# Patient Record
Sex: Female | Born: 1965 | Race: White | Hispanic: No | Marital: Single | Smoking: Never smoker
Health system: Southern US, Community
[De-identification: ages and names within clinical notes are randomized; demographics above are authoritative.]

## PROBLEM LIST (undated history)

## (undated) DIAGNOSIS — G43909 Migraine, unspecified, not intractable, without status migrainosus: Secondary | ICD-10-CM

## (undated) DIAGNOSIS — F419 Anxiety disorder, unspecified: Secondary | ICD-10-CM

## (undated) DIAGNOSIS — M1990 Unspecified osteoarthritis, unspecified site: Secondary | ICD-10-CM

## (undated) HISTORY — DX: Unspecified osteoarthritis, unspecified site: M19.90

## (undated) HISTORY — DX: Anxiety disorder, unspecified: F41.9

## (undated) HISTORY — PX: BREAST CYST ASPIRATION: SHX578

## (undated) HISTORY — PX: LEG SURGERY: SHX1003

---

## 2007-09-05 ENCOUNTER — Emergency Department: Payer: Self-pay | Admitting: Emergency Medicine

## 2008-04-10 ENCOUNTER — Ambulatory Visit: Payer: Self-pay | Admitting: Orthopaedic Surgery

## 2008-04-29 ENCOUNTER — Ambulatory Visit: Payer: Self-pay

## 2008-05-17 ENCOUNTER — Ambulatory Visit: Payer: Self-pay | Admitting: Unknown Physician Specialty

## 2008-05-21 ENCOUNTER — Ambulatory Visit: Payer: Self-pay | Admitting: Unknown Physician Specialty

## 2010-02-05 ENCOUNTER — Emergency Department: Payer: Self-pay | Admitting: Emergency Medicine

## 2010-03-28 ENCOUNTER — Ambulatory Visit: Payer: Self-pay | Admitting: Obstetrics and Gynecology

## 2010-04-11 ENCOUNTER — Ambulatory Visit: Payer: Self-pay | Admitting: Obstetrics and Gynecology

## 2010-11-20 ENCOUNTER — Ambulatory Visit: Payer: Self-pay | Admitting: Obstetrics and Gynecology

## 2010-12-23 ENCOUNTER — Ambulatory Visit: Payer: Self-pay

## 2011-03-26 ENCOUNTER — Ambulatory Visit: Payer: Self-pay | Admitting: Internal Medicine

## 2011-08-06 ENCOUNTER — Ambulatory Visit: Payer: Self-pay | Admitting: Obstetrics and Gynecology

## 2013-01-01 ENCOUNTER — Ambulatory Visit: Payer: Self-pay | Admitting: Obstetrics and Gynecology

## 2015-05-05 ENCOUNTER — Other Ambulatory Visit: Payer: Self-pay | Admitting: Obstetrics and Gynecology

## 2015-05-05 DIAGNOSIS — Z1231 Encounter for screening mammogram for malignant neoplasm of breast: Secondary | ICD-10-CM

## 2015-05-06 ENCOUNTER — Ambulatory Visit
Admission: RE | Admit: 2015-05-06 | Discharge: 2015-05-06 | Disposition: A | Discharge status: Home / Self Care | Payer: BC Managed Care – PPO | Source: Ambulatory Visit | Attending: Obstetrics and Gynecology | Admitting: Obstetrics and Gynecology

## 2015-05-06 DIAGNOSIS — Z1231 Encounter for screening mammogram for malignant neoplasm of breast: Secondary | ICD-10-CM | POA: Insufficient documentation

## 2015-05-16 ENCOUNTER — Other Ambulatory Visit: Payer: Self-pay | Admitting: Orthopedic Surgery

## 2015-05-16 DIAGNOSIS — G8929 Other chronic pain: Secondary | ICD-10-CM

## 2015-05-16 DIAGNOSIS — M25561 Pain in right knee: Principal | ICD-10-CM

## 2015-05-23 ENCOUNTER — Ambulatory Visit: Payer: BC Managed Care – PPO

## 2015-12-01 ENCOUNTER — Other Ambulatory Visit: Payer: Self-pay | Admitting: Obstetrics and Gynecology

## 2015-12-01 DIAGNOSIS — Z1231 Encounter for screening mammogram for malignant neoplasm of breast: Secondary | ICD-10-CM

## 2016-05-07 ENCOUNTER — Ambulatory Visit: Payer: BC Managed Care – PPO | Attending: Obstetrics and Gynecology

## 2016-07-23 ENCOUNTER — Ambulatory Visit
Admission: RE | Admit: 2016-07-23 | Discharge: 2016-07-23 | Disposition: A | Discharge status: Home / Self Care | Payer: BC Managed Care – PPO | Source: Ambulatory Visit | Attending: Obstetrics and Gynecology | Admitting: Obstetrics and Gynecology

## 2016-07-23 DIAGNOSIS — Z1231 Encounter for screening mammogram for malignant neoplasm of breast: Secondary | ICD-10-CM | POA: Insufficient documentation

## 2017-05-28 ENCOUNTER — Other Ambulatory Visit: Payer: Self-pay | Admitting: Obstetrics and Gynecology

## 2017-05-28 DIAGNOSIS — Z1231 Encounter for screening mammogram for malignant neoplasm of breast: Secondary | ICD-10-CM

## 2017-11-25 ENCOUNTER — Ambulatory Visit: Payer: BC Managed Care – PPO | Attending: Obstetrics and Gynecology

## 2018-04-21 ENCOUNTER — Other Ambulatory Visit: Payer: Self-pay | Admitting: Obstetrics and Gynecology

## 2018-04-21 DIAGNOSIS — Z1231 Encounter for screening mammogram for malignant neoplasm of breast: Secondary | ICD-10-CM

## 2018-05-16 ENCOUNTER — Ambulatory Visit
Admission: RE | Admit: 2018-05-16 | Discharge: 2018-05-16 | Disposition: A | Discharge status: Home / Self Care | Payer: BC Managed Care – PPO | Source: Ambulatory Visit | Attending: Obstetrics and Gynecology | Admitting: Obstetrics and Gynecology

## 2018-05-16 DIAGNOSIS — Z1231 Encounter for screening mammogram for malignant neoplasm of breast: Secondary | ICD-10-CM

## 2018-09-21 ENCOUNTER — Encounter: Payer: Self-pay | Admitting: Emergency Medicine

## 2018-09-21 ENCOUNTER — Other Ambulatory Visit: Payer: Self-pay

## 2018-09-21 ENCOUNTER — Emergency Department
Admission: EM | Admit: 2018-09-21 | Discharge: 2018-09-21 | Disposition: A | Discharge status: Home / Self Care | Payer: BC Managed Care – PPO | Attending: Emergency Medicine | Admitting: Emergency Medicine

## 2018-09-21 DIAGNOSIS — R51 Headache: Secondary | ICD-10-CM | POA: Diagnosis not present

## 2018-09-21 DIAGNOSIS — R519 Headache, unspecified: Secondary | ICD-10-CM

## 2018-09-21 HISTORY — DX: Migraine, unspecified, not intractable, without status migrainosus: G43.909

## 2018-09-21 MED ORDER — DIPHENHYDRAMINE HCL 50 MG/ML IJ SOLN
25.0000 mg | Freq: Once | INTRAMUSCULAR | Status: AC
  Administered 2018-09-21: 25 mg via INTRAVENOUS
  Filled 2018-09-21: qty 1

## 2018-09-21 MED ORDER — SODIUM CHLORIDE 0.9 % IV BOLUS
1000.0000 mL | Freq: Once | INTRAVENOUS | Status: AC
  Administered 2018-09-21: 1000 mL via INTRAVENOUS

## 2018-09-21 MED ORDER — MAGNESIUM SULFATE 2 GM/50ML IV SOLN
2.0000 g | Freq: Once | INTRAVENOUS | Status: AC
  Administered 2018-09-21: 2 g via INTRAVENOUS
  Filled 2018-09-21: qty 50

## 2018-09-21 MED ORDER — KETOROLAC TROMETHAMINE 30 MG/ML IJ SOLN
30.0000 mg | Freq: Once | INTRAMUSCULAR | Status: AC
Start: 2018-09-21 — End: 2018-09-21
  Administered 2018-09-21: 30 mg via INTRAVENOUS
  Filled 2018-09-21: qty 1

## 2018-09-21 MED ORDER — PROCHLORPERAZINE EDISYLATE 10 MG/2ML IJ SOLN
10.0000 mg | Freq: Once | INTRAMUSCULAR | Status: AC
  Administered 2018-09-21: 10 mg via INTRAVENOUS
  Filled 2018-09-21: qty 2

## 2018-09-21 NOTE — ED Notes (Addendum)
Pt reports nausea has improved, but continues to complain of a headache, IVF infusing. Informed patient we are waiting for room in ED to open. Son sitting with patient in subwait.

## 2018-09-21 NOTE — ED Triage Notes (Signed)
Pt arrived via POV with reports of migraine since awakening this morning. Pt states she took her migraine medication this morning but vomited shortly after taking it. Pt continues to feel nauseated. Pt has some light sensitivity. Pt states it feels like bad migraine which she states she hasn't had in a long time.

## 2018-09-21 NOTE — ED Provider Notes (Signed)
[facility] Emergency Department Provider Note ____________________________________________   I have reviewed the triage vital signs and the triage nursing note.  HISTORY  Chief Complaint Migraine   Historian Patient  HPI Peggy Stewart is a 52 y.o. female with history of migraines, takes Imitrex at home, 200 mg at onset, however with this headache that started this morning gradual onset when she woke up this morning worsening associate with nausea, threw up and then took a second Imitrex thinking that she probably threw up the initial pill, threw up again.  States it has been a while since she had a migraine this bad but this does feel like a typical migraine just with significant nausea.  No abdominal pain.  No diarrhea.  No chest pain.  No trouble breathing or shortness of breath.  No vision changes.  No focal weakness or numbness or confusion or altered mental status.  No seizure.  Headache moderate to severe about 8 out of him when she presented.  6 out of 10 after initial triage medications that were ordered Compazine and Benadryl and IV fluids.       Past Medical History:  Diagnosis Date  . Migraine     There are no active problems to display for this patient.   Past Surgical History:  Procedure Laterality Date  . BREAST CYST ASPIRATION Left     Prior to Admission medications   Not on File    Allergies  Allergen Reactions  . Penicillins Other (See Comments)    C. Diff colitis C diff     Family History  Problem Relation Age of Onset  . Breast cancer Mother 4165    Social History Social History   Tobacco Use  . Smoking status: Never Smoker  . Smokeless tobacco: Never Used  Substance Use Topics  . Alcohol use: Not on file  . Drug use: Not on file    Review of Systems  Constitutional: Negative for fever. Eyes: Negative for visual changes. ENT: Negative for sore throat. Cardiovascular: Negative for chest  pain. Respiratory: Negative for shortness of breath. Gastrointestinal: Negative for abdominal pain, vomiting and diarrhea. Genitourinary: Negative for dysuria. Musculoskeletal: Negative for back pain. Skin: Negative for rash. Neurological: Positive as per HPI for headache.  ____________________________________________   PHYSICAL EXAM:  VITAL SIGNS: ED Triage Vitals  Enc Vitals Group     BP 09/21/18 1143 (!) 162/90     Pulse Rate 09/21/18 1143 75     Resp 09/21/18 1143 18     Temp 09/21/18 1143 (!) 97.5 F (36.4 C)     Temp Source 09/21/18 1143 Oral     SpO2 09/21/18 1143 98 %     Weight 09/21/18 1145 120 lb (54.4 kg)     Height 09/21/18 1145 5\' 1"  (1.549 m)     Head Circumference --      Peak Flow --      Pain Score 09/21/18 1145 8     Pain Loc --      Pain Edu? --      Excl. in GC? --      Constitutional: Alert and oriented.  HEENT      Head: Normocephalic and atraumatic.      Eyes: Conjunctivae are normal. Pupils equal and round.       Ears:         Nose: No congestion/rhinnorhea.      Mouth/Throat: Mucous membranes are moist.      Neck: No stridor.  Cardiovascular/Chest: Normal rate, regular rhythm.  No murmurs, rubs, or gallops. Respiratory: Normal respiratory effort without tachypnea nor retractions. Breath sounds are clear and equal bilaterally. No wheezes/rales/rhonchi. Gastrointestinal: Soft. No distention, no guarding, no rebound. Nontender.    Genitourinary/rectal:Deferred Musculoskeletal: Nontender with normal range of motion in all extremities. No joint effusions.  No lower extremity tenderness.  No edema. Neurologic:  Normal speech and language. No gross or focal neurologic deficits are appreciated. Skin:  Skin is warm, dry and intact. No rash noted. Psychiatric: Mood and affect are normal. Speech and behavior are normal. Patient exhibits appropriate insight and judgment.   ____________________________________________  LABS (pertinent  positives/negatives) I, Governor Rooks, MD the attending physician have reviewed the labs noted below.  Labs Reviewed - No data to display  ____________________________________________    EKG I, Governor Rooks, MD, the attending physician have personally viewed and interpreted all ECGs.  None ____________________________________________  RADIOLOGY   None __________________________________________  PROCEDURES  Procedure(s) performed: None  Procedures  Critical Care performed: None   ____________________________________________  ED COURSE / ASSESSMENT AND PLAN  Pertinent labs & imaging results that were available during my care of the patient were reviewed by me and considered in my medical decision making (see chart for details).    Patient here for nonspecific headache that seems similar to her prior migraines without any focal neurologic findings, or any high risk red flag features of the headache.  Patient had some improvement from 8 out of 10 down to 6 out of 10 after initial treatment.  Patient was requesting to take additional Imitrex, however she did take 400 mg although she threw up, it is unclear how much she would have absorbed so I did recommend against this.   Reevaluation, pain down to 2 out of 10, okay for discharge.    CONSULTATIONS: None  Patient / Family / Caregiver informed of clinical course, medical decision-making process, and agree with plan.   I discussed return precautions, follow-up instructions, and discharge instructions with patient and/or family.  Discharge Instructions : Return to emergency department immediately for any worsening condition including new or worsening headache, confusion altered mental status, fever, vision changes, weakness, numbness, or any other symptoms concerning to you.    ___________________________________________   FINAL CLINICAL IMPRESSION(S) / ED DIAGNOSES   Final diagnoses:  Headache, unspecified  headache type      ___________________________________________         Note: This dictation was prepared with Dragon dictation. Any transcriptional errors that result from this process are unintentional    Governor Rooks, MD 09/21/18 1610

## 2018-09-21 NOTE — ED Notes (Signed)
Discussed with Dr. Pershing ProudSchaevitz pt's chief complaint, new orders received for IV fluids, compazine and benadryl.

## 2018-09-21 NOTE — Discharge Instructions (Signed)
Return to emergency department immediately for any worsening condition including new or worsening headache, confusion altered mental status, fever, vision changes, weakness, numbness, or any other symptoms concerning to you.

## 2018-09-21 NOTE — ED Notes (Signed)
Pt up to bathroom.  Steady on feet.  No assistance needed.

## 2018-09-21 NOTE — ED Notes (Signed)
Pt placed in subwait, son sitting with patient, waiting for room to become available.

## 2019-05-18 ENCOUNTER — Other Ambulatory Visit: Payer: Self-pay | Admitting: Obstetrics and Gynecology

## 2019-05-18 DIAGNOSIS — Z1231 Encounter for screening mammogram for malignant neoplasm of breast: Secondary | ICD-10-CM

## 2019-11-18 ENCOUNTER — Ambulatory Visit: Payer: BC Managed Care – PPO | Attending: Internal Medicine

## 2020-04-11 ENCOUNTER — Other Ambulatory Visit: Payer: Self-pay | Admitting: Obstetrics and Gynecology

## 2020-04-11 DIAGNOSIS — Z1231 Encounter for screening mammogram for malignant neoplasm of breast: Secondary | ICD-10-CM

## 2020-04-26 ENCOUNTER — Ambulatory Visit
Admission: RE | Admit: 2020-04-26 | Discharge: 2020-04-26 | Disposition: A | Discharge status: Home / Self Care | Payer: BC Managed Care – PPO | Source: Ambulatory Visit | Attending: Obstetrics and Gynecology | Admitting: Obstetrics and Gynecology

## 2020-04-26 DIAGNOSIS — Z1231 Encounter for screening mammogram for malignant neoplasm of breast: Secondary | ICD-10-CM | POA: Diagnosis not present

## 2020-05-23 ENCOUNTER — Encounter: Payer: Self-pay | Admitting: *Deleted

## 2021-04-20 ENCOUNTER — Other Ambulatory Visit: Payer: Self-pay | Admitting: Obstetrics and Gynecology

## 2022-02-12 ENCOUNTER — Ambulatory Visit: Payer: BC Managed Care – PPO | Admitting: Family Medicine

## 2022-02-12 ENCOUNTER — Encounter: Payer: Self-pay | Admitting: Family Medicine

## 2022-02-12 ENCOUNTER — Other Ambulatory Visit (HOSPITAL_COMMUNITY)
Admission: RE | Admit: 2022-02-12 | Discharge: 2022-02-12 | Disposition: A | Discharge status: Home / Self Care | Payer: BC Managed Care – PPO | Source: Ambulatory Visit | Attending: Family Medicine | Admitting: Family Medicine

## 2022-02-12 VITALS — BP 164/95 | HR 98 | Wt 126.0 lb

## 2022-02-12 DIAGNOSIS — G43829 Menstrual migraine, not intractable, without status migrainosus: Secondary | ICD-10-CM

## 2022-02-12 DIAGNOSIS — Z1211 Encounter for screening for malignant neoplasm of colon: Secondary | ICD-10-CM | POA: Diagnosis not present

## 2022-02-12 DIAGNOSIS — Z124 Encounter for screening for malignant neoplasm of cervix: Secondary | ICD-10-CM | POA: Diagnosis present

## 2022-02-12 DIAGNOSIS — Z1239 Encounter for other screening for malignant neoplasm of breast: Secondary | ICD-10-CM

## 2022-02-12 DIAGNOSIS — F411 Generalized anxiety disorder: Secondary | ICD-10-CM

## 2022-02-12 DIAGNOSIS — R030 Elevated blood-pressure reading, without diagnosis of hypertension: Secondary | ICD-10-CM

## 2022-02-12 DIAGNOSIS — Z01419 Encounter for gynecological examination (general) (routine) without abnormal findings: Secondary | ICD-10-CM

## 2022-02-12 DIAGNOSIS — J45990 Exercise induced bronchospasm: Secondary | ICD-10-CM | POA: Insufficient documentation

## 2022-02-12 NOTE — Progress Notes (Signed)
LMP:10/2021 was very light no cycle since .hx of heavy bleeding. ?Last Pap:04/2020 WBL Hx of Abnormal paps. ?Contraception:no method ?STD Screening:No ?Family Hx of Breast Cancer:Yes Mother passed at 64. ?Family Hx of Ovarian Cancer:No ? ?Pt notes Anxiety ?Wants Annual Labs  ? ? ?Will Repeat Blood pressure at check out. Cuff was not reading.  ? ?  ?

## 2022-02-12 NOTE — Assessment & Plan Note (Signed)
99203 - to get cuff and see how BPs are at home. If persistently elevated, will see primary care and can start something. ?

## 2022-02-12 NOTE — Progress Notes (Signed)
Subjective:  ?  ? Peggy Stewart is a 56 y.o. female and is here for a comprehensive physical exam. The patient reports no problems. LMP was 10/2021. Light. Previously had heavy menses with terrible migraines with these. Cycles have lightened and her migraines have improved with peri-menopause. Has hot flashes, but no night flashes. Has h/o abnormal pap and has had a colposcopy. Last was 5 years ago. ?Reports anxiety, life-long. She is not wanting meds right now, but does have early am awakening and "catastropic" thinking. She does some centering. ? ? ?The following portions of the patient's history were reviewed and updated as appropriate: allergies, current medications, past family history, past medical history, past social history, past surgical history, and problem list. ? ?Review of Systems ?Pertinent items noted in HPI and remainder of comprehensive ROS otherwise negative.  ? ?Objective:  ? ? BP (!) 181/93   Pulse 93   Wt 126 lb (57.2 kg)   BMI 23.81 kg/m?  ?General appearance: alert, cooperative, and appears stated age ?Head: Normocephalic, without obvious abnormality, atraumatic ?Neck: no adenopathy, supple, symmetrical, trachea midline, and thyroid not enlarged, symmetric, no tenderness/mass/nodules ?Lungs: clear to auscultation bilaterally ?Breasts: normal appearance, no masses or tenderness ?Heart: regular rate and rhythm, S1, S2 normal, no murmur, click, rub or gallop ?Abdomen: soft, non-tender; bowel sounds normal; no masses,  no organomegaly ?Pelvic: cervix normal in appearance, external genitalia normal, no adnexal masses or tenderness, no cervical motion tenderness, uterus normal size, shape, and consistency, and vagina normal without discharge ?Extremities: extremities normal, atraumatic, no cyanosis or edema ?Pulses: 2+ and symmetric ?Skin: Skin color, texture, turgor normal. No rashes or lesions ?Lymph nodes: Cervical, supraclavicular, and axillary nodes normal. ?Neurologic: Grossly normal   ?  ?Assessment:  ? ? Healthy female exam.    ?  ?Plan:  ? ?Problem List Items Addressed This Visit   ? ?  ? Unprioritized  ? Elevated blood pressure reading  ?  84536 - to get cuff and see how BPs are at home. If persistently elevated, will see primary care and can start something. ?  ?  ? Menstrual migraine  ?  46803 - better with peri-menopause--has imitrex if needed ?  ?  ? Relevant Medications  ? ibuprofen (ADVIL) 200 MG tablet  ? SUMAtriptan (IMITREX) 25 MG tablet  ? Generalized anxiety disorder  ?  21224 - discussed mindfulness, meditation, yoga, self care and meds if needed.  ?  ?  ? ?Other Visit Diagnoses   ? ? Screening for malignant neoplasm of cervix    -  Primary  ? 82500  ? Relevant Orders  ? Cytology - PAP( [place])  ? Breast screening      ? 37048  ? Relevant Orders  ? MM 3D SCREEN BREAST BILATERAL  ? Encounter for gynecological examination without abnormal finding      ? 88916  ? Relevant Orders  ? CBC  ? TSH  ? Follicle stimulating hormone  ? Comprehensive metabolic panel  ? Hemoglobin A1c  ? Lipid panel  ? Screen for colon cancer      ? 94503  ? Relevant Orders  ? Ambulatory referral to Gastroenterology  ? ?  ? ?Return in 1 year (on 02/13/2023). ? ?  ?See After Visit Summary for Counseling Recommendations  ? ?

## 2022-02-12 NOTE — Assessment & Plan Note (Addendum)
97673 - better with peri-menopause--has imitrex if needed ?

## 2022-02-12 NOTE — Assessment & Plan Note (Signed)
09983 - discussed mindfulness, meditation, yoga, self care and meds if needed.  ?

## 2022-02-13 ENCOUNTER — Encounter: Payer: Self-pay | Admitting: Family Medicine

## 2022-02-13 LAB — LIPID PANEL
Chol/HDL Ratio: 3.6 ratio (ref 0.0–4.4)
Cholesterol, Total: 256 mg/dL — ABNORMAL HIGH (ref 100–199)
HDL: 72 mg/dL (ref >39)
LDL Chol Calc (NIH): 167 mg/dL — ABNORMAL HIGH (ref 0–99)
Triglycerides: 97 mg/dL (ref 0–149)
VLDL Cholesterol Cal: 17 mg/dL (ref 5–40)

## 2022-02-13 LAB — COMPREHENSIVE METABOLIC PANEL
ALT: 20 IU/L (ref 0–32)
AST: 23 IU/L (ref 0–40)
Albumin/Globulin Ratio: 1.9 (ref 1.2–2.2)
Albumin: 4.7 g/dL (ref 3.8–4.9)
Alkaline Phosphatase: 105 IU/L (ref 44–121)
BUN/Creatinine Ratio: 15 (ref 9–23)
BUN: 9 mg/dL (ref 6–24)
Bilirubin Total: 0.5 mg/dL (ref 0.0–1.2)
CO2: 24 mmol/L (ref 20–29)
Calcium: 9.5 mg/dL (ref 8.7–10.2)
Chloride: 105 mmol/L (ref 96–106)
Creatinine, Ser: 0.59 mg/dL (ref 0.57–1.00)
Globulin, Total: 2.5 g/dL (ref 1.5–4.5)
Glucose: 42 mg/dL — ABNORMAL LOW (ref 70–99)
Potassium: 4 mmol/L (ref 3.5–5.2)
Sodium: 144 mmol/L (ref 134–144)
Total Protein: 7.2 g/dL (ref 6.0–8.5)
eGFR: 108 mL/min/{1.73_m2} (ref >59)

## 2022-02-13 LAB — TSH: TSH: 1.54 u[IU]/mL (ref 0.450–4.500)

## 2022-02-13 LAB — FOLLICLE STIMULATING HORMONE: FSH: 98.1 m[IU]/mL

## 2022-02-13 LAB — CBC
Hematocrit: 42 % (ref 34.0–46.6)
Hemoglobin: 14.6 g/dL (ref 11.1–15.9)
MCH: 30.9 pg (ref 26.6–33.0)
MCHC: 34.8 g/dL (ref 31.5–35.7)
MCV: 89 fL (ref 79–97)
Platelets: 271 10*3/uL (ref 150–450)
RBC: 4.73 x10E6/uL (ref 3.77–5.28)
RDW: 12.1 % (ref 11.7–15.4)
WBC: 6.7 10*3/uL (ref 3.4–10.8)

## 2022-02-13 LAB — HEMOGLOBIN A1C
Est. average glucose Bld gHb Est-mCnc: 80 mg/dL
Hgb A1c MFr Bld: 4.4 % — ABNORMAL LOW (ref 4.8–5.6)

## 2022-02-14 LAB — SPECIMEN STATUS REPORT

## 2022-02-14 LAB — INSULIN AND C-PEPTIDE, SERUM
C-Peptide: 1.3 ng/mL (ref 1.1–4.4)
INSULIN: 3.7 u[IU]/mL (ref 2.6–24.9)

## 2022-02-15 LAB — CYTOLOGY - PAP
Comment: NEGATIVE
Diagnosis: NEGATIVE
Diagnosis: REACTIVE
High risk HPV: NEGATIVE

## 2022-03-14 ENCOUNTER — Ambulatory Visit
Admission: RE | Admit: 2022-03-14 | Discharge: 2022-03-14 | Disposition: A | Discharge status: Home / Self Care | Payer: BC Managed Care – PPO | Source: Ambulatory Visit | Attending: Family Medicine | Admitting: Family Medicine

## 2022-03-14 DIAGNOSIS — Z1239 Encounter for other screening for malignant neoplasm of breast: Secondary | ICD-10-CM | POA: Diagnosis present

## 2022-03-14 DIAGNOSIS — Z1231 Encounter for screening mammogram for malignant neoplasm of breast: Secondary | ICD-10-CM | POA: Diagnosis not present

## 2023-02-25 ENCOUNTER — Ambulatory Visit: Payer: BC Managed Care – PPO | Admitting: Family Medicine

## 2023-04-22 ENCOUNTER — Other Ambulatory Visit (HOSPITAL_COMMUNITY)
Admission: RE | Admit: 2023-04-22 | Discharge: 2023-04-22 | Disposition: A | Discharge status: Home / Self Care | Payer: BC Managed Care – PPO | Source: Ambulatory Visit | Attending: Family Medicine | Admitting: Family Medicine

## 2023-04-22 ENCOUNTER — Encounter: Payer: Self-pay | Admitting: Family Medicine

## 2023-04-22 ENCOUNTER — Ambulatory Visit (INDEPENDENT_AMBULATORY_CARE_PROVIDER_SITE_OTHER): Payer: BC Managed Care – PPO | Admitting: Family Medicine

## 2023-04-22 VITALS — BP 144/82 | HR 91 | Wt 127.0 lb

## 2023-04-22 DIAGNOSIS — F411 Generalized anxiety disorder: Secondary | ICD-10-CM | POA: Diagnosis not present

## 2023-04-22 DIAGNOSIS — Z1231 Encounter for screening mammogram for malignant neoplasm of breast: Secondary | ICD-10-CM | POA: Diagnosis not present

## 2023-04-22 DIAGNOSIS — Z01419 Encounter for gynecological examination (general) (routine) without abnormal findings: Secondary | ICD-10-CM

## 2023-04-22 DIAGNOSIS — Z124 Encounter for screening for malignant neoplasm of cervix: Secondary | ICD-10-CM | POA: Diagnosis present

## 2023-04-22 DIAGNOSIS — Z1339 Encounter for screening examination for other mental health and behavioral disorders: Secondary | ICD-10-CM | POA: Diagnosis not present

## 2023-04-22 DIAGNOSIS — E162 Hypoglycemia, unspecified: Secondary | ICD-10-CM

## 2023-04-22 DIAGNOSIS — C44519 Basal cell carcinoma of skin of other part of trunk: Secondary | ICD-10-CM | POA: Insufficient documentation

## 2023-04-22 DIAGNOSIS — G43829 Menstrual migraine, not intractable, without status migrainosus: Secondary | ICD-10-CM | POA: Diagnosis not present

## 2023-04-22 DIAGNOSIS — Z780 Asymptomatic menopausal state: Secondary | ICD-10-CM | POA: Diagnosis not present

## 2023-04-22 MED ORDER — SUMATRIPTAN SUCCINATE 25 MG PO TABS
25.0000 mg | ORAL_TABLET | Freq: Once | ORAL | 2 refills | Status: AC
Start: 2023-04-22 — End: 2023-04-22

## 2023-04-22 MED ORDER — HYDROXYZINE HCL 10 MG PO TABS
10.0000 mg | ORAL_TABLET | Freq: Three times a day (TID) | ORAL | 3 refills | Status: AC | PRN
Start: 2023-04-22 — End: ?

## 2023-04-22 NOTE — Progress Notes (Addendum)
Subjective:     Peggy Stewart is a 57 y.o. female and is here for a comprehensive physical exam. The patient reports no problems. NO cycle since since spring of 2023. Hot flashes are still better. Having some arthritis pain in hips, back, knees. Migraines headaches, needs imitrex refill. Much better since menopause. Had basal cell removed recently. Having anxiety--doe snot want long term meds, but something for situational anxiety.  The following portions of the patient's history were reviewed and updated as appropriate: allergies, current medications, past family history, past medical history, past social history, past surgical history, and problem list.  Review of Systems Pertinent items noted in HPI and remainder of comprehensive ROS otherwise negative.   Objective:  Chaperone present for exam   BP (!) 144/82   Pulse 91   Wt 127 lb (57.6 kg)   BMI 24.00 kg/m  General appearance: alert, cooperative, and appears stated age Head: Normocephalic, without obvious abnormality, atraumatic Neck: no adenopathy, supple, symmetrical, trachea midline, and thyroid not enlarged, symmetric, no tenderness/mass/nodules Lungs: clear to auscultation bilaterally Breasts: normal appearance, no masses or tenderness Heart: regular rate and rhythm, S1, S2 normal, no murmur, click, rub or gallop Abdomen: soft, non-tender; bowel sounds normal; no masses,  no organomegaly Pelvic: cervix normal in appearance, external genitalia normal, no adnexal masses or tenderness, no cervical motion tenderness, uterus normal size, shape, and consistency, and vagina normal without discharge Extremities: extremities normal, atraumatic, no cyanosis or edema Pulses: 2+ and symmetric Skin: Skin color, texture, turgor normal. No rashes or lesions Lymph nodes: Cervical, supraclavicular, and axillary nodes normal. Neurologic: Grossly normal    Assessment:    Healthy female exam.      Plan:  Screening for malignant  neoplasm of cervix - Plan: Cytology - PAP  Encounter for gynecological examination without abnormal finding - Plan: CBC, TSH, CBC, Comprehensive metabolic panel, Hemoglobin A1c, Lipid panel  Encounter for screening mammogram for malignant neoplasm of breast - Plan: MM 3D SCREENING MAMMOGRAM BILATERAL BREAST  Menstrual migraine without status migrainosus, not intractable - 99214 -  refillled her imitrex - Plan: SUMAtriptan (IMITREX) 25 MG tablet  Menopause - 99214 - FSH > 50 last year - minimal hot flashes  Basal cell carcinoma (BCC) of back - 72536 -  to f/u with derm yearly  Generalized anxiety disorder - 64403 - will give episodic treatment, may take 1/2 as needed if too drowsy. - Plan: hydrOXYzine (ATARAX) 10 MG tablet    See After Visit Summary for Counseling Recommendations

## 2023-04-22 NOTE — Progress Notes (Signed)
Patient presents for Annual.  Will repeat B/P pt notes white coat syndrome.  LMP: No period since 02/2022 Last pap: Date: 02/12/2022 WNL   pt wants to do pap every year Contraception: None Mammogram:  03/14/22 WNL. Family Hx of Breast Cancer:Mother  STD Screening: Declines Flu Vaccine : N/A  CC:  Migraines has used Sumatriptan in the past . Wants to discuss situational anxiety.  Fun Fact: Patient enjoys reading. Loves Fiction books.

## 2023-04-23 LAB — COMPREHENSIVE METABOLIC PANEL
ALT: 20 IU/L (ref 0–32)
AST: 20 IU/L (ref 0–40)
Albumin: 4.8 g/dL (ref 3.8–4.9)
Alkaline Phosphatase: 103 IU/L (ref 44–121)
BUN/Creatinine Ratio: 27 — ABNORMAL HIGH (ref 9–23)
BUN: 14 mg/dL (ref 6–24)
Bilirubin Total: 0.4 mg/dL (ref 0.0–1.2)
CO2: 24 mmol/L (ref 20–29)
Calcium: 9.3 mg/dL (ref 8.7–10.2)
Chloride: 102 mmol/L (ref 96–106)
Creatinine, Ser: 0.51 mg/dL — ABNORMAL LOW (ref 0.57–1.00)
Globulin, Total: 2.5 g/dL (ref 1.5–4.5)
Glucose: 43 mg/dL — ABNORMAL LOW (ref 70–99)
Potassium: 3.5 mmol/L (ref 3.5–5.2)
Sodium: 141 mmol/L (ref 134–144)
Total Protein: 7.3 g/dL (ref 6.0–8.5)
eGFR: 111 mL/min/{1.73_m2} (ref >59)

## 2023-04-23 LAB — TSH: TSH: 1.74 u[IU]/mL (ref 0.450–4.500)

## 2023-04-23 LAB — LIPID PANEL
Chol/HDL Ratio: 4 ratio (ref 0.0–4.4)
Cholesterol, Total: 286 mg/dL — ABNORMAL HIGH (ref 100–199)
HDL: 72 mg/dL (ref >39)
LDL Chol Calc (NIH): 198 mg/dL — ABNORMAL HIGH (ref 0–99)
Triglycerides: 96 mg/dL (ref 0–149)
VLDL Cholesterol Cal: 16 mg/dL (ref 5–40)

## 2023-04-23 LAB — CBC
Hematocrit: 43 % (ref 34.0–46.6)
Hemoglobin: 14.3 g/dL (ref 11.1–15.9)
MCH: 30.7 pg (ref 26.6–33.0)
MCHC: 33.3 g/dL (ref 31.5–35.7)
MCV: 92 fL (ref 79–97)
Platelets: 253 10*3/uL (ref 150–450)
RBC: 4.66 x10E6/uL (ref 3.77–5.28)
RDW: 11.9 % (ref 11.7–15.4)
WBC: 7.7 10*3/uL (ref 3.4–10.8)

## 2023-04-23 LAB — HEMOGLOBIN A1C
Est. average glucose Bld gHb Est-mCnc: 77 mg/dL
Hgb A1c MFr Bld: 4.3 % — ABNORMAL LOW (ref 4.8–5.6)

## 2023-04-24 LAB — CYTOLOGY - PAP
Comment: NEGATIVE
Diagnosis: UNDETERMINED — AB
High risk HPV: NEGATIVE

## 2023-04-24 NOTE — Addendum Note (Signed)
Addended by: Reva Bores on: 04/24/2023 06:33 PM   Modules accepted: Orders

## 2023-04-29 ENCOUNTER — Other Ambulatory Visit: Payer: Self-pay | Admitting: *Deleted

## 2023-04-29 ENCOUNTER — Other Ambulatory Visit: Payer: BC Managed Care – PPO

## 2023-04-29 DIAGNOSIS — E162 Hypoglycemia, unspecified: Secondary | ICD-10-CM

## 2023-05-01 ENCOUNTER — Other Ambulatory Visit: Payer: BC Managed Care – PPO

## 2023-05-01 DIAGNOSIS — E162 Hypoglycemia, unspecified: Secondary | ICD-10-CM

## 2023-05-02 LAB — CORTISOL: Cortisol: 8.8 ug/dL (ref 6.2–19.4)

## 2023-05-06 ENCOUNTER — Encounter: Payer: Self-pay | Admitting: Family Medicine

## 2023-05-17 ENCOUNTER — Ambulatory Visit
Admission: RE | Admit: 2023-05-17 | Discharge: 2023-05-17 | Disposition: A | Discharge status: Home / Self Care | Payer: BC Managed Care – PPO | Source: Ambulatory Visit | Attending: Family Medicine | Admitting: Family Medicine

## 2023-05-17 DIAGNOSIS — Z1231 Encounter for screening mammogram for malignant neoplasm of breast: Secondary | ICD-10-CM | POA: Diagnosis present

## 2023-06-28 IMAGING — MG MM DIGITAL SCREENING BILAT W/ TOMO AND CAD
8 series · 9 of 24 positions shown · non-contrast
Comparison: Previous exam(s).

CLINICAL DATA: Screening.

EXAM:
DIGITAL SCREENING BILATERAL MAMMOGRAM WITH TOMOSYNTHESIS AND CAD
TECHNIQUE: Bilateral screening digital craniocaudal and mediolateral oblique
mammograms were obtained. Bilateral screening digital breast
tomosynthesis was performed. The images were evaluated with
computer-aided detection.

[L MLO synth-2D]
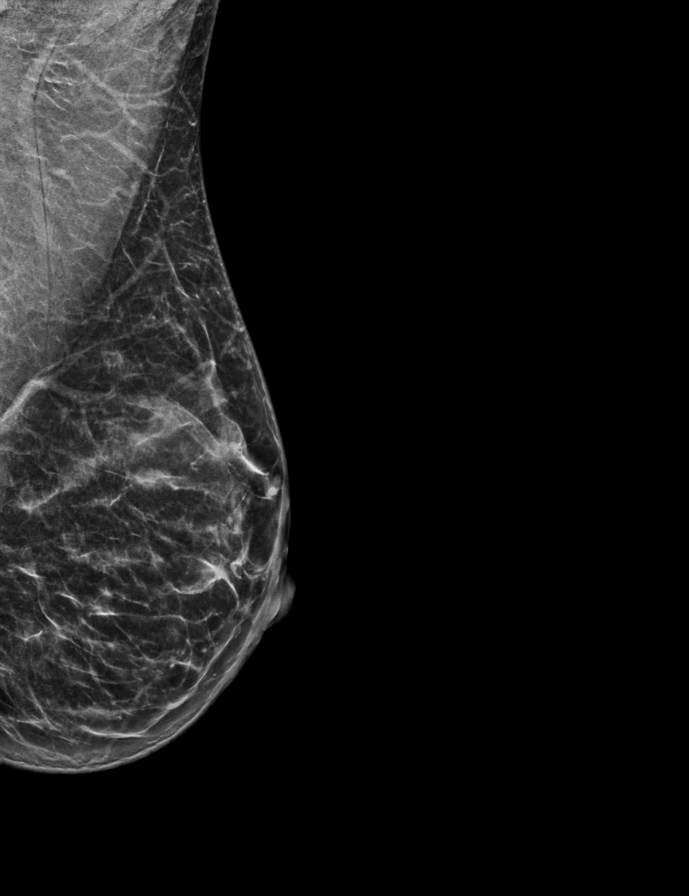

[R CC synth-2D]
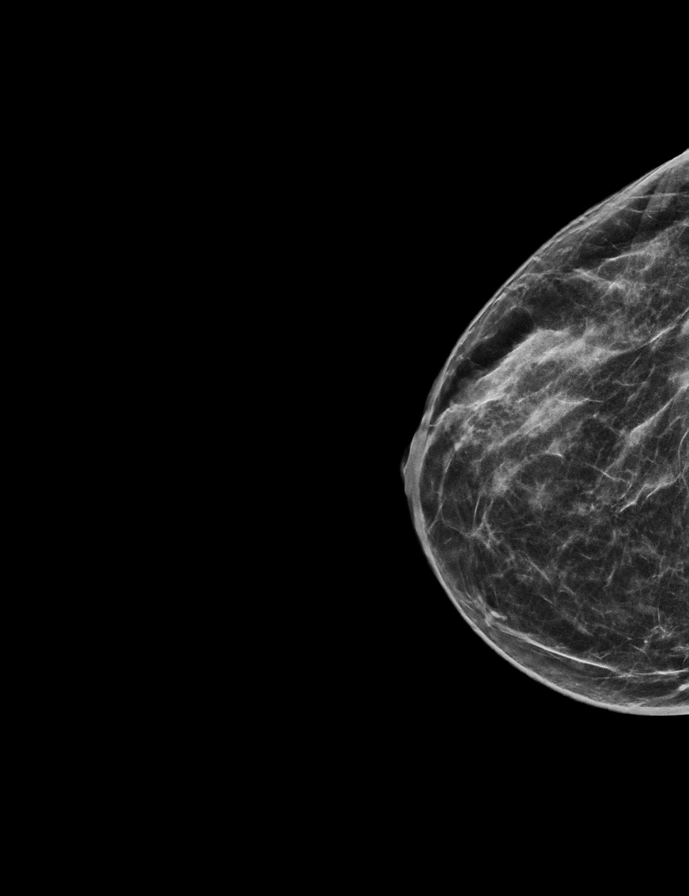

[R MLO synth-2D]
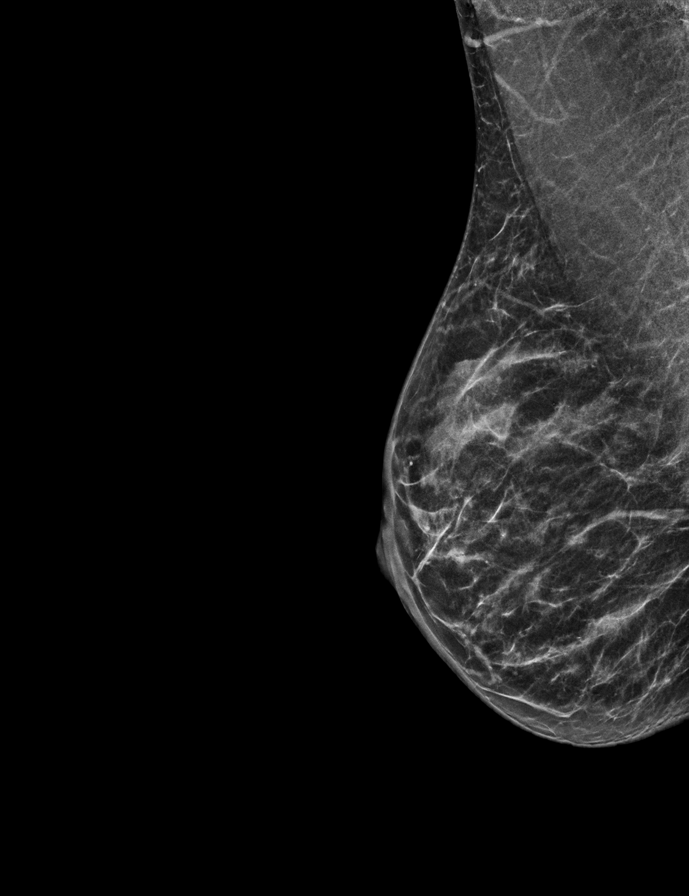

[L CC synth-2D]
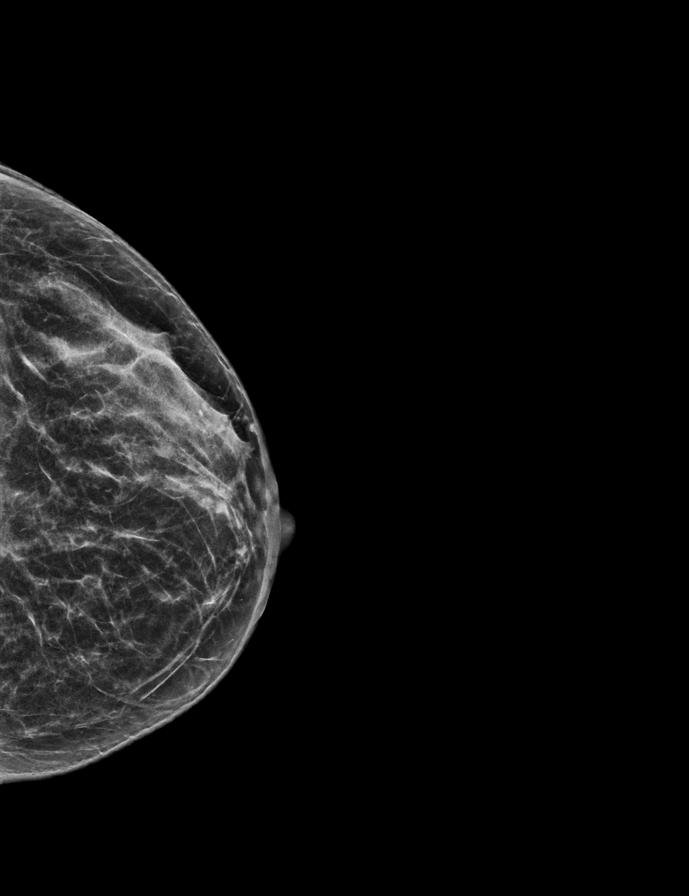

[R MLO tomo · 2 of 48 frames shown]
[frame 16/48]
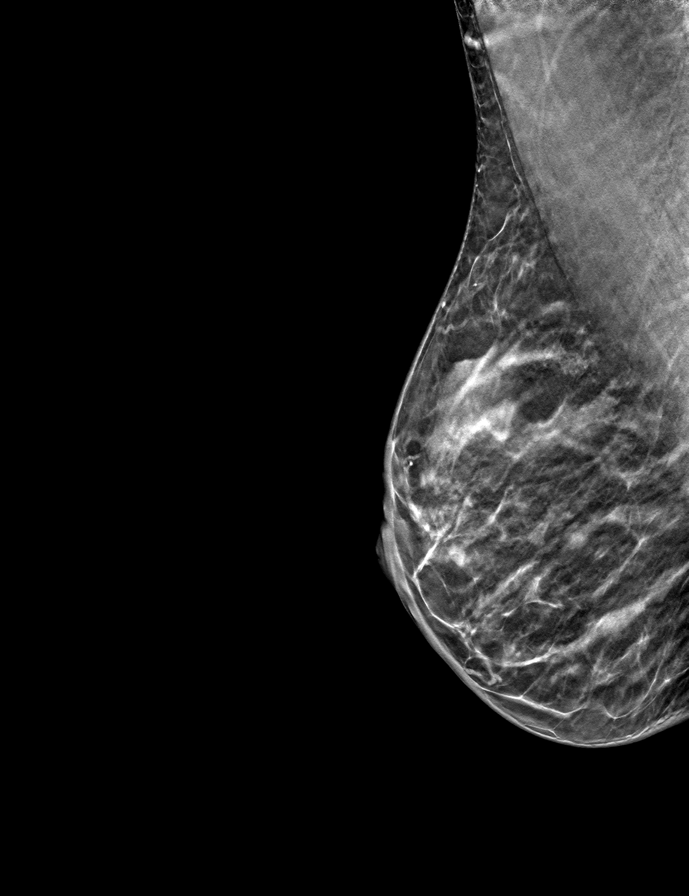
[frame 25/48]
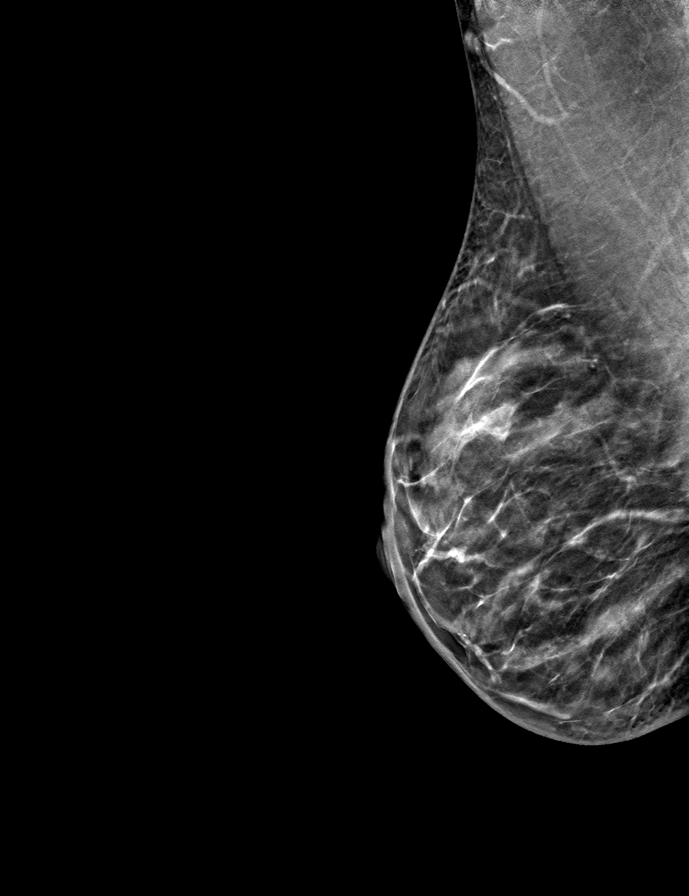

[L CC tomo · tomo slice 25/50.0]
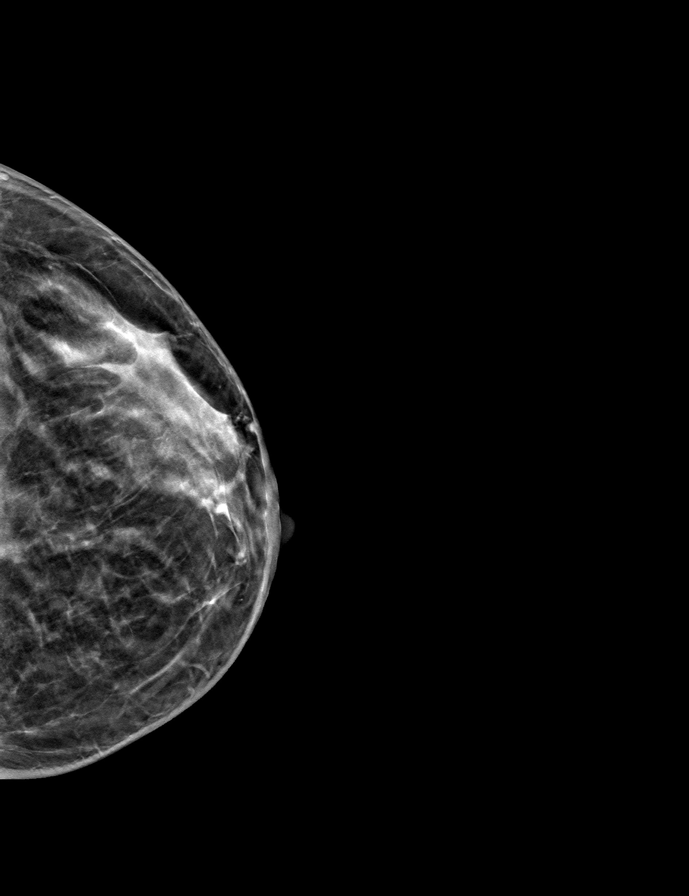

[R CC tomo · tomo slice 27/52.0]
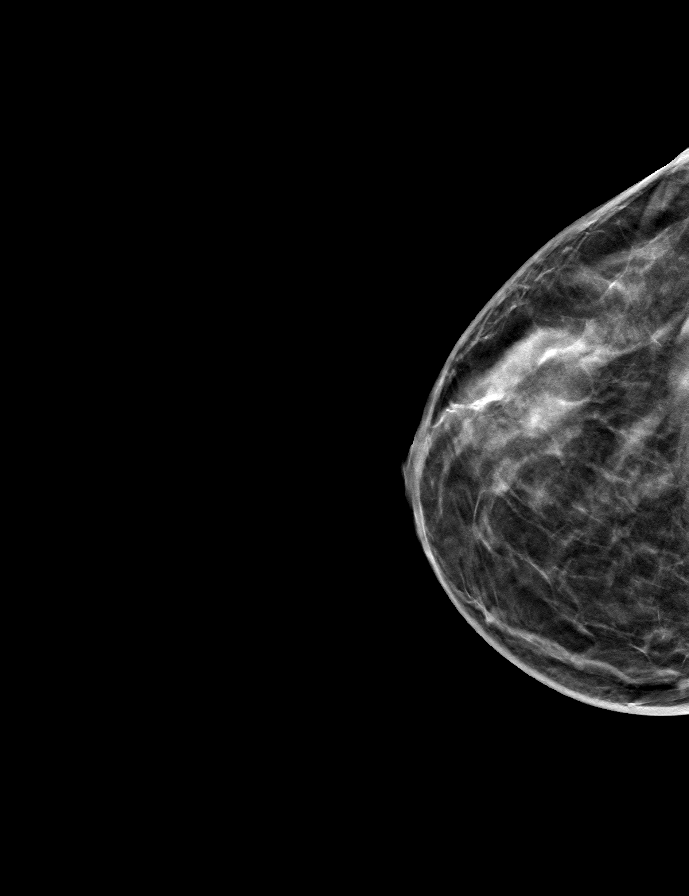

[L MLO tomo · tomo slice 27/52.0]
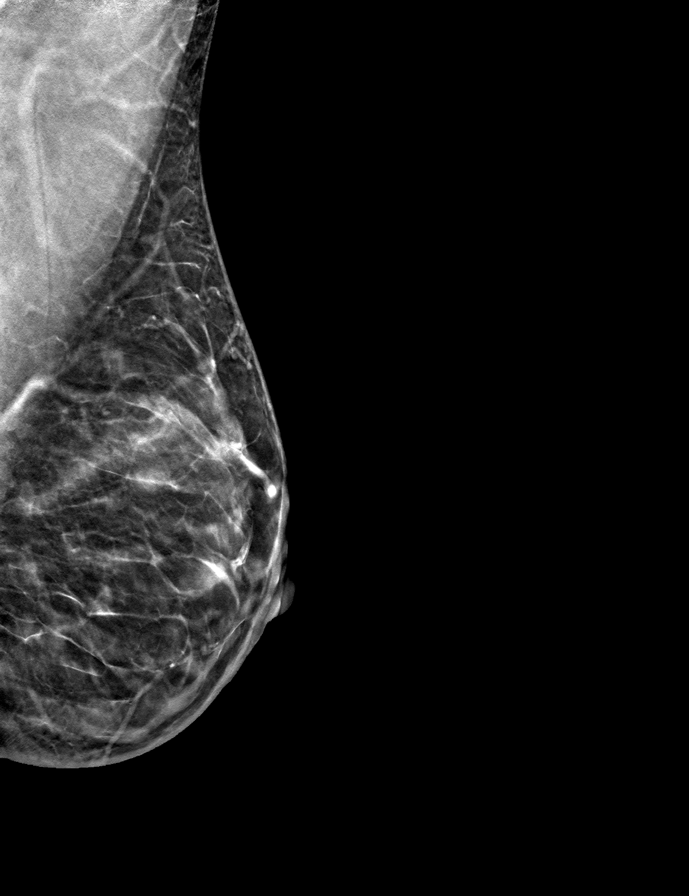

[9 of 24 positions shown; findings below may reference images not displayed]

ACR Breast Density Category c: The breast tissue is heterogeneously
dense, which may obscure small masses.
FINDINGS: There are no findings suspicious for malignancy.
IMPRESSION: No mammographic evidence of malignancy. A result letter of this
screening mammogram will be mailed directly to the patient.

RECOMMENDATION:
Screening mammogram in one year. (Code:Q3-W-BC3)

BI-RADS CATEGORY  1: Negative.

## 2023-08-26 ENCOUNTER — Ambulatory Visit: Payer: BC Managed Care – PPO | Admitting: Endocrinology

## 2023-11-07 ENCOUNTER — Ambulatory Visit: Payer: BC Managed Care – PPO | Admitting: Endocrinology

## 2024-06-11 ENCOUNTER — Ambulatory Visit (INDEPENDENT_AMBULATORY_CARE_PROVIDER_SITE_OTHER): Payer: Self-pay | Admitting: Family Medicine

## 2024-06-11 ENCOUNTER — Other Ambulatory Visit (HOSPITAL_COMMUNITY)
Admission: RE | Admit: 2024-06-11 | Discharge: 2024-06-11 | Disposition: A | Discharge status: Home / Self Care | Source: Ambulatory Visit | Attending: Family Medicine | Admitting: Family Medicine

## 2024-06-11 ENCOUNTER — Encounter: Payer: Self-pay | Admitting: Family Medicine

## 2024-06-11 VITALS — BP 147/90 | HR 92 | Wt 129.0 lb

## 2024-06-11 DIAGNOSIS — E7849 Other hyperlipidemia: Secondary | ICD-10-CM | POA: Diagnosis not present

## 2024-06-11 DIAGNOSIS — Z1211 Encounter for screening for malignant neoplasm of colon: Secondary | ICD-10-CM | POA: Diagnosis not present

## 2024-06-11 DIAGNOSIS — I10 Essential (primary) hypertension: Secondary | ICD-10-CM

## 2024-06-11 DIAGNOSIS — Z1231 Encounter for screening mammogram for malignant neoplasm of breast: Secondary | ICD-10-CM

## 2024-06-11 DIAGNOSIS — M1710 Unilateral primary osteoarthritis, unspecified knee: Secondary | ICD-10-CM

## 2024-06-11 DIAGNOSIS — Z124 Encounter for screening for malignant neoplasm of cervix: Secondary | ICD-10-CM | POA: Insufficient documentation

## 2024-06-11 DIAGNOSIS — Z01411 Encounter for gynecological examination (general) (routine) with abnormal findings: Secondary | ICD-10-CM | POA: Diagnosis not present

## 2024-06-11 DIAGNOSIS — Z01419 Encounter for gynecological examination (general) (routine) without abnormal findings: Secondary | ICD-10-CM

## 2024-06-11 DIAGNOSIS — E162 Hypoglycemia, unspecified: Secondary | ICD-10-CM

## 2024-06-11 NOTE — Progress Notes (Unsigned)
 Patient presents for Annual.  LMP: Patient's last menstrual period was 09/19/2018.  Last pap: 2024-abnormal -ASCUS HPV- Contraception: Post-menopausal Mammogram: Due, last mammogram: 05/17/23 STD Screening: Declines Flu Vaccine : N/A  CC: Hasn't been to Endo yet -Needs new referral

## 2024-06-11 NOTE — Progress Notes (Unsigned)
 Subjective:     Peggy Stewart is a 58 y.o. female and is here for a comprehensive physical exam. The patient reports {problems:16946}. Feels like everything is off a little. Poor sleep. Has some hot flashes. Reduces caffiene and trying natural issues. Some bladder issues.      The following portions of the patient's history were reviewed and updated as appropriate: allergies, current medications, past family history, past medical history, past social history, past surgical history, and problem list.  Review of Systems Pertinent items noted in HPI and remainder of comprehensive ROS otherwise negative.   Objective:    BP (!) 183/98   Pulse 94   Wt 129 lb (58.5 kg)   LMP 09/19/2018   BMI 24.37 kg/m  General appearance: {general exam:16600} Head: {head exam:30909::Normocephalic, without obvious abnormality,atraumatic} Neck: {neck exam:17463::no adenopathy,no carotid bruit,no JVD,supple, symmetrical, trachea midline,thyroid not enlarged, symmetric, no tenderness/mass/nodules} Lungs: {lung exam:16931} Breasts: {breast exam:13139::normal appearance, no masses or tenderness} Heart: {heart exam:5510} Abdomen: {abdominal exam:16834} Pelvic: {pelvic exam:16852::cervix normal in appearance,external genitalia normal,no adnexal masses or tenderness,no cervical motion tenderness,rectovaginal septum normal,uterus normal size, shape, and consistency,vagina normal without discharge} Extremities: {extremity exam:5109} Pulses: {pulse exam:10866::2+ and symmetric} Skin: {skin exam:31329::Skin color, texture, turgor normal. No rashes or lesions} Lymph nodes: {lymph node exam:14039::Cervical, supraclavicular, and axillary nodes normal.} Neurologic: {neuro exam:17854}    Assessment:    Healthy female exam. ***     Plan:     See After Visit Summary for Counseling Recommendations

## 2024-06-12 ENCOUNTER — Ambulatory Visit: Payer: Self-pay | Admitting: Family Medicine

## 2024-06-12 DIAGNOSIS — E785 Hyperlipidemia, unspecified: Secondary | ICD-10-CM | POA: Insufficient documentation

## 2024-06-12 DIAGNOSIS — M1710 Unilateral primary osteoarthritis, unspecified knee: Secondary | ICD-10-CM | POA: Insufficient documentation

## 2024-06-12 DIAGNOSIS — I10 Essential (primary) hypertension: Secondary | ICD-10-CM | POA: Insufficient documentation

## 2024-06-12 LAB — COMPREHENSIVE METABOLIC PANEL WITH GFR
ALT: 13 IU/L (ref 0–32)
AST: 21 IU/L (ref 0–40)
Albumin: 4.7 g/dL (ref 3.8–4.9)
Alkaline Phosphatase: 114 IU/L (ref 44–121)
BUN/Creatinine Ratio: 25 — ABNORMAL HIGH (ref 9–23)
BUN: 13 mg/dL (ref 6–24)
Bilirubin Total: 0.4 mg/dL (ref 0.0–1.2)
CO2: 22 mmol/L (ref 20–29)
Calcium: 9.3 mg/dL (ref 8.7–10.2)
Chloride: 103 mmol/L (ref 96–106)
Creatinine, Ser: 0.51 mg/dL — ABNORMAL LOW (ref 0.57–1.00)
Globulin, Total: 2.4 g/dL (ref 1.5–4.5)
Glucose: 41 mg/dL — ABNORMAL LOW (ref 70–99)
Potassium: 4.1 mmol/L (ref 3.5–5.2)
Sodium: 142 mmol/L (ref 134–144)
Total Protein: 7.1 g/dL (ref 6.0–8.5)
eGFR: 110 mL/min/1.73 (ref >59)

## 2024-06-12 LAB — LIPID PANEL
Chol/HDL Ratio: 4 ratio (ref 0.0–4.4)
Cholesterol, Total: 270 mg/dL — ABNORMAL HIGH (ref 100–199)
HDL: 67 mg/dL (ref >39)
LDL Chol Calc (NIH): 182 mg/dL — ABNORMAL HIGH (ref 0–99)
Triglycerides: 117 mg/dL (ref 0–149)
VLDL Cholesterol Cal: 21 mg/dL (ref 5–40)

## 2024-06-12 LAB — CBC
Hematocrit: 43.9 % (ref 34.0–46.6)
Hemoglobin: 14.3 g/dL (ref 11.1–15.9)
MCH: 30.7 pg (ref 26.6–33.0)
MCHC: 32.6 g/dL (ref 31.5–35.7)
MCV: 94 fL (ref 79–97)
Platelets: 248 x10E3/uL (ref 150–450)
RBC: 4.66 x10E6/uL (ref 3.77–5.28)
RDW: 11.8 % (ref 11.7–15.4)
WBC: 6.1 x10E3/uL (ref 3.4–10.8)

## 2024-06-12 LAB — HEMOGLOBIN A1C
Est. average glucose Bld gHb Est-mCnc: <74 mg/dL
Hgb A1c MFr Bld: <4.2 % — ABNORMAL LOW (ref 4.8–5.6)

## 2024-06-12 LAB — TSH: TSH: 1.33 u[IU]/mL (ref 0.450–4.500)

## 2024-06-12 NOTE — Assessment & Plan Note (Addendum)
 00784 - refer to primary care Lipid Panel     Component Value Date/Time   CHOL 270 (H) 06/11/2024 1353   TRIG 117 06/11/2024 1353   HDL 67 06/11/2024 1353   CHOLHDL 4.0 06/11/2024 1353   LDLCALC 182 (H) 06/11/2024 1353   LABVLDL 21 06/11/2024 1353

## 2024-06-12 NOTE — Assessment & Plan Note (Addendum)
 00784 - Hgb A1C is 4.2 and 4.3. random CBGs are 42 and 43. She has no symptoms. She has normal Cortisol and insulin levels and C-peptide. She has exhausted my knowledge of what is going on. Referral to Endocrinology. Lab Results  Component Value Date   HGBA1C <4.2 (L) 06/11/2024   HGBA1C 4.3 (L) 04/22/2023   HGBA1C 4.4 (L) 02/12/2022

## 2024-06-12 NOTE — Assessment & Plan Note (Signed)
 00784 - Initial BP 183/98, down to 147/90 after evaluation. Some white coat. But up routinely. See PCP for management.     06/11/2024    2:06 PM 06/11/2024    1:15 PM 04/22/2023    2:10 PM  Vitals with BMI  Weight  129 lbs   Systolic 147 183 855  Diastolic 90 98 82  Pulse 92 94

## 2024-06-12 NOTE — Assessment & Plan Note (Signed)
 00784 - Pt. Is unable to move easily down the exam table for pelvic due to knee arthropathy. Needs ortho referral.

## 2024-06-18 LAB — CYTOLOGY - PAP
Comment: NEGATIVE
Diagnosis: NEGATIVE
High risk HPV: NEGATIVE

## 2024-08-27 ENCOUNTER — Encounter

## 2024-09-28 ENCOUNTER — Ambulatory Visit
Admission: RE | Admit: 2024-09-28 | Discharge: 2024-09-28 | Disposition: A | Discharge status: Home / Self Care | Source: Ambulatory Visit | Attending: Family Medicine | Admitting: Family Medicine

## 2024-09-28 DIAGNOSIS — Z1231 Encounter for screening mammogram for malignant neoplasm of breast: Secondary | ICD-10-CM | POA: Insufficient documentation
